# Patient Record
Sex: Male | Born: 1980 | Race: White | Hispanic: Yes | Marital: Married | Smoking: Current every day smoker
Health system: Southern US, Community
[De-identification: ages and names within clinical notes are randomized; demographics above are authoritative.]

## PROBLEM LIST (undated history)

## (undated) ENCOUNTER — Emergency Department: Payer: Self-pay | Source: Home / Self Care

## (undated) ENCOUNTER — Emergency Department (HOSPITAL_COMMUNITY): Disposition: A | Discharge status: Home / Self Care | Payer: Self-pay

## (undated) HISTORY — PX: OTHER SURGICAL HISTORY: SHX169

## (undated) HISTORY — PX: APPENDECTOMY: SHX54

---

## 2014-08-27 ENCOUNTER — Emergency Department (HOSPITAL_COMMUNITY)
Admission: EM | Admit: 2014-08-27 | Discharge: 2014-08-28 | Disposition: A | Discharge status: Home / Self Care | Payer: Self-pay | Attending: Emergency Medicine | Admitting: Emergency Medicine

## 2014-08-27 ENCOUNTER — Encounter (HOSPITAL_COMMUNITY): Payer: Self-pay | Admitting: *Deleted

## 2014-08-27 ENCOUNTER — Emergency Department (HOSPITAL_COMMUNITY): Payer: Self-pay

## 2014-08-27 DIAGNOSIS — Y998 Other external cause status: Secondary | ICD-10-CM | POA: Insufficient documentation

## 2014-08-27 DIAGNOSIS — S161XXA Strain of muscle, fascia and tendon at neck level, initial encounter: Secondary | ICD-10-CM | POA: Insufficient documentation

## 2014-08-27 DIAGNOSIS — Z720 Tobacco use: Secondary | ICD-10-CM | POA: Insufficient documentation

## 2014-08-27 DIAGNOSIS — Y9389 Activity, other specified: Secondary | ICD-10-CM | POA: Insufficient documentation

## 2014-08-27 DIAGNOSIS — Y9289 Other specified places as the place of occurrence of the external cause: Secondary | ICD-10-CM | POA: Insufficient documentation

## 2014-08-27 DIAGNOSIS — S298XXA Other specified injuries of thorax, initial encounter: Secondary | ICD-10-CM

## 2014-08-27 DIAGNOSIS — S29001A Unspecified injury of muscle and tendon of front wall of thorax, initial encounter: Secondary | ICD-10-CM | POA: Insufficient documentation

## 2014-08-27 DIAGNOSIS — S43401A Unspecified sprain of right shoulder joint, initial encounter: Secondary | ICD-10-CM | POA: Insufficient documentation

## 2014-08-27 DIAGNOSIS — W11XXXA Fall on and from ladder, initial encounter: Secondary | ICD-10-CM | POA: Insufficient documentation

## 2014-08-27 MED ORDER — OXYCODONE-ACETAMINOPHEN 5-325 MG PO TABS
2.0000 | ORAL_TABLET | Freq: Once | ORAL | Status: AC
  Administered 2014-08-27: 2 via ORAL
  Filled 2014-08-27: qty 2

## 2014-08-27 NOTE — ED Notes (Signed)
Pt fell 10 feet from ladder today around 1630 and landed on right side, c/o right elbow, right shoulder and right elbow pain, stiff in right neck and shoulder area

## 2014-08-27 NOTE — ED Provider Notes (Signed)
CSN: [id number]     Arrival date & time 08/27/14  2240 History   This chart was scribed for Joya Gaskinsonald W Hiilei Gerst, MD by Evon Slackerrance Branch, ED Scribe. This patient was seen in room APA06/[facility]06 and the patient's care was started at 11:00 PM.     Chief Complaint  Patient presents with  . Fall   Patient is a 33 y.o. male presenting with fall. The history is provided by the patient. No language interpreter was used.  Fall This is a new problem. The current episode started 6 to 12 hours ago. The problem occurs rarely. The problem has not changed since onset.Pertinent negatives include no chest pain, no abdominal pain and no headaches. Nothing relieves the symptoms. He has tried nothing for the symptoms.   HPI Comments: Jerry Hogan is a 33 y.o. male who presents to the Emergency Department complaining of fall onset 4:30 PM today. Pt states that he feel from a 10 ft ladder and landed on his right side. He states he is having right shoulder pain and neck pain. He states that he is unsure if he hit his head and denies LOC. Denies headache, chest pain, abdominal pain or back pain.   PMH - none  Past Surgical History  Procedure Laterality Date  . Appendectomy    . Ptsd     History reviewed. No pertinent family history. History  Substance Use Topics  . Smoking status: Current Every Day Smoker    Types: Cigarettes  . Smokeless tobacco: Not on file  . Alcohol Use: No    Review of Systems  Cardiovascular: Negative for chest pain.  Gastrointestinal: Negative for abdominal pain.  Musculoskeletal: Positive for arthralgias and neck pain. Negative for back pain.  Neurological: Negative for syncope and headaches.  All other systems reviewed and are negative.   Allergies  Review of patient's allergies indicates no known allergies.  Home Medications   Prior to Admission medications   Not on File   Triage Vitals: BP 155/105 mmHg  Pulse 69  Temp(Src) 98.1 F (36.7 C) (Oral)  Resp 18  Ht 5\' 7"   (1.702 m)  Wt 180 lb (81.647 kg)  BMI 28.19 kg/m2  SpO2 100%  Physical Exam  Nursing note and vitals reviewed.  CONSTITUTIONAL: Well developed/well nourished HEAD: Normocephalic/atraumatic EYES: EOMI/PERRL ENMT: Mucous membranes moist, No evidence of facial/nasal trauma NECK: no bruising to anterior neck SPINE:cervical spine tenderness, no thoracic or lumbar tenderness, No bruising/crepitance/stepoffs noted to spine CV: S1/S2 noted, no murmurs/rubs/gallops noted LUNGS: Lungs are clear to auscultation bilaterally, no apparent distress CHEST: right sided chest wall tenderness ABDOMEN: soft, nontender, no rebound or guarding, no bruising is noted GU:no cva tenderness NEURO: Pt is awake/alert, moves all extremitiesx4 EXTREMITIES: pulses normal, full ROM, Tenderness to palpation of right shoulder, All other extremities/joints palpated/ranged and nontender SKIN: warm, color normal PSYCH: no abnormalities of mood noted  ED Course  Procedures DIAGNOSTIC STUDIES: Oxygen Saturation is 100% on RA, normal by my interpretation.    COORDINATION OF CARE: 11:18 PM-Discussed treatment plan which includes X-ray of neck, right shoulder and chest and pain medication  with pt at bedside and pt agreed to plan.    Pt ambulatory He can range right shoulder He has no other new complaints He denies LOC - CT head deferred Arm sling given and referred to ortho   Imaging Review Dg Ribs Unilateral W/chest Right  08/27/2014   CLINICAL DATA:  Status post 10 foot fall from roof tonight. Right rib pain. Initial  encounter.  EXAM: RIGHT RIBS AND CHEST - 3+ VIEW  COMPARISON:  None.  FINDINGS: The lungs are clear. There is no pneumothorax or pleural effusion. Heart size is normal. No rib fracture is identified.  IMPRESSION: Negative examination.   Electronically Signed   By: Drusilla Kannerhomas  Dalessio M.D.   On: 08/27/2014 23:50   Dg Cervical Spine Complete  08/27/2014   CLINICAL DATA:  Status post 10 foot fall from a  roof tonight. Neck pain. Initial encounter.  EXAM: CERVICAL SPINE  4+ VIEWS  COMPARISON:  None.  FINDINGS: Vertebral body height and alignment are normal. Intervertebral disc space height is maintained. Neural foramina appear widely patent. Facet joints are unremarkable. Prevertebral soft tissues appear normal. The lung apices are clear.  IMPRESSION: Negative examination.   Electronically Signed   By: Drusilla Kannerhomas  Dalessio M.D.   On: 08/27/2014 23:51   Dg Shoulder Right  08/27/2014   CLINICAL DATA:  Pt c/o upper anterior shoulder, neck and rib pain s/p fall ten feet from standing on his roof tonight  EXAM: RIGHT SHOULDER - 2+ VIEW  COMPARISON:  None.  FINDINGS: There is no evidence of fracture or dislocation. There is no evidence of arthropathy or other focal bone abnormality. Soft tissues are unremarkable.  IMPRESSION: Negative.   Electronically Signed   By: Elberta Fortisaniel  Boyle M.D.   On: 08/27/2014 23:52      MDM   Final diagnoses:  Shoulder sprain, right, initial encounter  Blunt chest trauma, initial encounter  Cervical strain, acute, initial encounter        Nursing notes including past medical history and social history reviewed and considered in documentation xrays reviewed and considered   I personally performed the services described in this documentation, which was scribed in my presence. The recorded information has been reviewed and is accurate.       Joya Gaskinsonald W Maedell Hedger, MD 08/28/14 [phone]
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
## 2014-08-29 ENCOUNTER — Encounter (HOSPITAL_COMMUNITY): Payer: Self-pay | Admitting: *Deleted

## 2014-08-29 ENCOUNTER — Emergency Department (HOSPITAL_COMMUNITY)
Admission: EM | Admit: 2014-08-29 | Discharge: 2014-08-29 | Disposition: A | Discharge status: Home / Self Care | Payer: Self-pay | Attending: Emergency Medicine | Admitting: Emergency Medicine

## 2014-08-29 ENCOUNTER — Telehealth: Payer: Self-pay | Admitting: Orthopedic Surgery

## 2014-08-29 DIAGNOSIS — Y9389 Activity, other specified: Secondary | ICD-10-CM | POA: Insufficient documentation

## 2014-08-29 DIAGNOSIS — Y929 Unspecified place or not applicable: Secondary | ICD-10-CM | POA: Insufficient documentation

## 2014-08-29 DIAGNOSIS — M542 Cervicalgia: Secondary | ICD-10-CM | POA: Insufficient documentation

## 2014-08-29 DIAGNOSIS — S46911D Strain of unspecified muscle, fascia and tendon at shoulder and upper arm level, right arm, subsequent encounter: Secondary | ICD-10-CM | POA: Insufficient documentation

## 2014-08-29 DIAGNOSIS — W132XXD Fall from, out of or through roof, subsequent encounter: Secondary | ICD-10-CM | POA: Insufficient documentation

## 2014-08-29 DIAGNOSIS — Z720 Tobacco use: Secondary | ICD-10-CM | POA: Insufficient documentation

## 2014-08-29 MED ORDER — CYCLOBENZAPRINE HCL 10 MG PO TABS: 10.0000 mg | ORAL_TABLET | Freq: Two times a day (BID) | ORAL | Status: AC | PRN

## 2014-08-29 MED ORDER — HYDROCODONE-ACETAMINOPHEN 5-325 MG PO TABS: 1.0000 | ORAL_TABLET | ORAL | Status: AC | PRN

## 2014-08-29 NOTE — Discharge Instructions (Signed)
Cervical Sprain °A cervical sprain is an injury in the neck in which the strong, fibrous tissues (ligaments) that connect your neck bones stretch or tear. Cervical sprains can range from mild to severe. Severe cervical sprains can cause the neck vertebrae to be unstable. This can lead to damage of the spinal cord and can result in serious nervous system problems. The amount of time it takes for a cervical sprain to get better depends on the cause and extent of the injury. Most cervical sprains heal in 1 to 3 weeks. °CAUSES  °Severe cervical sprains may be caused by:  °· Contact sport injuries (such as from football, rugby, wrestling, hockey, auto racing, gymnastics, diving, martial arts, or boxing).   °· Motor vehicle collisions.   °· Whiplash injuries. This is an injury from a sudden forward and backward whipping movement of the head and neck.  °· Falls.   °Mild cervical sprains may be caused by:  °· Being in an awkward position, such as while cradling a telephone between your ear and shoulder.   °· Sitting in a chair that does not offer proper support.   °· Working at a poorly designed computer station.   °· Looking up or down for long periods of time.   °SYMPTOMS  °· Pain, soreness, stiffness, or a burning sensation in the front, back, or sides of the neck. This discomfort may develop immediately after the injury or slowly, 24 hours or more after the injury.   °· Pain or tenderness directly in the middle of the back of the neck.   °· Shoulder or upper back pain.   °· Limited ability to move the neck.   °· Headache.   °· Dizziness.   °· Weakness, numbness, or tingling in the hands or arms.   °· Muscle spasms.   °· Difficulty swallowing or chewing.   °· Tenderness and swelling of the neck.   °DIAGNOSIS  °Most of the time your health care provider can diagnose a cervical sprain by taking your history and doing a physical exam. Your health care provider will ask about previous neck injuries and any known neck  problems, such as arthritis in the neck. X-rays may be taken to find out if there are any other problems, such as with the bones of the neck. Other tests, such as a CT scan or MRI, may also be needed.  °TREATMENT  °Treatment depends on the severity of the cervical sprain. Mild sprains can be treated with rest, keeping the neck in place (immobilization), and pain medicines. Severe cervical sprains are immediately immobilized. Further treatment is done to help with pain, muscle spasms, and other symptoms and may include: °· Medicines, such as pain relievers, numbing medicines, or muscle relaxants.   °· Physical therapy. This may involve stretching exercises, strengthening exercises, and posture training. Exercises and improved posture can help stabilize the neck, strengthen muscles, and help stop symptoms from returning.   °HOME CARE INSTRUCTIONS  °· Put ice on the injured area.   °¨ Put ice in a plastic bag.   °¨ Place a towel between your skin and the bag.   °¨ Leave the ice on for 15-20 minutes, 3-4 times a day.   °· If your injury was severe, you may have been given a cervical collar to wear. A cervical collar is a two-piece collar designed to keep your neck from moving while it heals. °¨ Do not remove the collar unless instructed by your health care provider. °¨ If you have long hair, keep it outside of the collar. °¨ Ask your health care provider before making any adjustments to your collar. Minor   adjustments may be required over time to improve comfort and reduce pressure on your chin or on the back of your head. °¨ If you are allowed to remove the collar for cleaning or bathing, follow your health care provider's instructions on how to do so safely. °¨ Keep your collar clean by wiping it with mild soap and water and drying it completely. If the collar you have been given includes removable pads, remove them every 1-2 days and hand wash them with soap and water. Allow them to air dry. They should be completely  dry before you wear them in the collar. °¨ If you are allowed to remove the collar for cleaning and bathing, wash and dry the skin of your neck. Check your skin for irritation or sores. If you see any, tell your health care provider. °¨ Do not drive while wearing the collar.   °· Only take over-the-counter or prescription medicines for pain, discomfort, or fever as directed by your health care provider.   °· Keep all follow-up appointments as directed by your health care provider.   °· Keep all physical therapy appointments as directed by your health care provider.   °· Make any needed adjustments to your workstation to promote good posture.   °· Avoid positions and activities that make your symptoms worse.   °· Warm up and stretch before being active to help prevent problems.   °SEEK MEDICAL CARE IF:  °· Your pain is not controlled with medicine.   °· You are unable to decrease your pain medicine over time as planned.   °· Your activity level is not improving as expected.   °SEEK IMMEDIATE MEDICAL CARE IF:  °· You develop any bleeding. °· You develop stomach upset. °· You have signs of an allergic reaction to your medicine.   °· Your symptoms get worse.   °· You develop new, unexplained symptoms.   °· You have numbness, tingling, weakness, or paralysis in any part of your body.   °MAKE SURE YOU:  °· Understand these instructions. °· Will watch your condition. °· Will get help right away if you are not doing well or get worse. °Document Released: 08/03/2007 Document Revised: 10/11/2013 Document Reviewed: 04/13/2013 °ExitCare® Patient Information ©2015 [employer]. This information is not intended to replace advice given to you by your health care provider. Make sure you discuss any questions you have with your health care provider. ° ° °Emergency Department Resource Guide °1) Find a Doctor and Pay Out of Pocket °Although you won't have to find out who is covered by your insurance plan, it is a good idea to ask  around and get recommendations. You will then need to call the office and see if the doctor you have chosen will accept you as a new patient and what types of options they offer for patients who are self-pay. Some doctors offer discounts or will set up payment plans for their patients who do not have insurance, but you will need to ask so you aren't surprised when you get to your appointment. ° °2) Contact Your Local Health Department °Not all health departments have doctors that can see patients for sick visits, but many do, so it is worth a call to see if yours does. If you don't know where your local health department is, you can check in your phone book. The CDC also has a tool to help you locate your state's health department, and many state websites also have listings of all of their local health departments. ° °3) Find a Walk-in Clinic °If your illness is not   likely to be very severe or complicated, you may want to try a walk in clinic. These are popping up all over the country in pharmacies, drugstores, and shopping centers. They're usually staffed by nurse practitioners or physician assistants that have been trained to treat common illnesses and complaints. They're usually fairly quick and inexpensive. However, if you have serious medical issues or chronic medical problems, these are probably not your best option. ° °No Primary Care Doctor: °- Call [facility] at  832-8000 - they can help you locate a primary care doctor that  accepts your insurance, provides certain services, etc. °- Physician Referral Service- 1-800-533-3463 ° °Chronic Pain Problems: °Organization         Address  Phone   Notes  °[facility]  (336) 297-2271 Patients need to be referred by their primary care doctor.  ° °Medication Assistance: °Organization         Address  Phone   Notes  °[place] [facility] [address] °[place], [place] [address] (336) 641-8030 --Must be a  resident of [place] °-- Must have NO insurance coverage whatsoever (no Medicaid/ Medicare, etc.) °-- The pt. MUST have a primary care doctor that directs their care regularly and follows them in the community °  °[facility]  (866) 331-1348   °[employer]  (888) 892-1162   ° °Agencies that provide inexpensive medical care: °Organization         Address  Phone   Notes  °[facility]  (336) 832-8035   °[facility]    (336) 832-7272   °[facility] [address] °[place], [place] [address] (336) 832-4777   °[facility] [address], °[place] (336) 271-4999   °[facility]    (336) 373-0678   °[facility]    (336) 272-1050   °[facility] ° [address], [place] Phone:  (336) 832-4444, Fax:  (336) 832-4440 Hours of Operation:  9 am - 6 pm, M-F.  Also accepts Medicaid/Medicare and self-pay.  °[facility] ° [address], [place] Phone: (336) 832-3150, Fax: (336) 832-3151. Hours of Operation:  8:30 am - 5:30 pm, M-F.  Also accepts Medicaid and self-pay.  °[facility] [place] [address], [place] Phone: (336) 878-6027   °[facility] [address], [place], [place] (336)723-1848, Ext. 123 Mondays & Thursdays: 7-9 AM.  First 15 patients are seen on a first come, first serve basis. °  ° °Medicaid-accepting [place] Providers: ° °Organization         Address  Phone   Notes  °[facility] [address], [place] (336) 641-2100 Also accepts self-pay patients.  °[facility] [address], [place] ° (336) 856-9996   °[facility] [address], [place] (336) 288-8857   °[facility] [address] [place] [address], [place] (336) 299-7000   °Veita Bland [address], [place]  ° (336) 373-1557 Only accepts  [place] Access Medicaid patients after they have their name applied to their card.  ° °Self-Pay (no insurance) in [place]: ° °Organization         Address  Phone   Notes  °Sickle Cell Patients, [facility] [address], [place] (336) 832-1970   °   [facility] [address], [place] (336) 832-4400   °[facility] [place] ° [address], [place] (336) 992-4800   °[facility]/Dr. Osei-Bonsu ° [address] [place] [address], [place] or [address], [place] (336) 841-8500 Phone number for both [place] and [place] locations is the same.  °[facility] [address], [place] (336) 299-0000   °[facility] [address] [place] [address], [place] or [address] (336) 852-7530 °(336) 878-2260   °[facility] [address], [place] (336) 350-1642, phone; (336) 294-5005, fax Sees patients 1st and 3rd Saturday of every month.  Must not qualify for public or private insurance (i.e. Medicaid, Medicare, [place] Health Choice, Veterans' Benefits) • Household income should be no more than 200% of the poverty level •The clinic cannot treat you if you are pregnant or think you are pregnant • Sexually transmitted diseases are not treated at the clinic.  ° ° °Dental Care: °Organization         Address  Phone  Notes  °[facility] [address], [place] (336) 641-6152 Accepts children up to age 21 who are enrolled in Medicaid or [place] Health Choice; pregnant women with a Medicaid card; and children who have applied for Medicaid or [place] Health Choice, but were declined, whose parents can pay a reduced fee at time of service.  °[facility] [place]  [address], [place] (336) 641-7733 Accepts children up to age 21 who are enrolled in Medicaid or [place] Health Choice; pregnant women  with a Medicaid card; and children who have applied for Medicaid or [place] Health Choice, but were declined, whose parents can pay a reduced fee at time of service.  °[facility] ° [address], [place] (336) 641-4533 Patients are seen by appointment only. Walk-ins are not accepted. [facility] will see patients 18 years of age and older. °Monday - Tuesday (8am-5pm) °Most Wednesdays (8:30-5pm) °$30 per visit, cash only  °[facility] ° [address], [place] (336) 641-4533 Patients are seen by appointment only. Walk-ins are not accepted. [facility] will see patients 18 years of age and older. °One Wednesday Evening (Monthly: Volunteer Based).  $30 per visit, cash only  °[facility]  (919) 537-3737 for adults; Children under age 4, call Graduate Pediatric Dentistry at (919) [phone]. Children aged 4-14, please call (919) [phone] to request a pediatric application. ° Dental services are provided in all areas of dental care including fillings, crowns and bridges, complete and partial dentures, implants, gum treatment, root canals, and extractions. Preventive care is also provided. Treatment is provided to both adults and children. °Patients are selected via a lottery and there is often a waiting list. °  °[facility] [address], °[place] ° (336) 763-8833 [email or web address] °  °[facility] [address], [place], [place] (336)723-1848, Ext. 123 Second and Fourth Thursday of each month, opens at 6:30 AM; Clinic ends at 9 AM.  Patients are seen on a first-come first-served basis, and a limited number are seen during each clinic.  ° °[facility] ° [address], [place], [place] (336) 723-7904   Eligibility Requirements °You must have lived in [place], [place], or [place] counties for at least the last three months. °    You cannot be eligible for state or federal sponsored healthcare  insurance, including Veterans Administration, Medicaid, or Medicare. °  You generally cannot be eligible for healthcare insurance through your employer.  °  How to apply: °Eligibility screenings are held every Tuesday and Wednesday afternoon from 1:00 pm until 4:00 pm. You do not need an appointment for the interview!  °[facility] [address], [place], [place] 336-631-2330   °[facility]  336-342-8273   °[facility]  336-703-3100   °[facility]  336-570-6415   ° °Behavioral Health Resources in the Community: °Intensive Outpatient Programs °Organization         Address  Phone  Notes  °[facility] [address], [place], [place] 336-878-6098   °[facility] [address], [place], [place] 336-832-9800   °[facility] [address], [place], [place] ° 336-882-2125   °[facility] [address],  °[place], [place] 1-800-853-5163 or 336-641-4981   °Substance Abuse Resources °Organization         Address  Phone  Notes  °[facility]  336-882-2125   °[facility]  336-784-9470   °[facility]  336-285-9073   °[facility]  336-845-3988   °Residential & Outpatient Substance Abuse Program  1-800-659-3381   °Psychological Services °Organization         Address  Phone  Notes  °[facility]  336- 832-9600   °[facility]  336- 378-7881   °[facility] [address], [place] 1-800-853-5163 or 336-641-4981   ° °Mobile Crisis Teams °Organization         Address  Phone  Notes  °[facility], Mobile Crisis Care Unit  1-877-626-1772   °Assertive °[facility] ° [address] [place], [place] 336-834-9664   °Sharon DeEsch [address] °[place] 336-554-5454   ° °Self-Help/Support Groups °Organization         Address  Phone             Notes  °[facility] - variety of support groups  336- 373-1402 Call for more information  °Narcotics Anonymous (NA), [facility] [address], °[place]  2 meetings at this location  ° °Residential Treatment Programs °Organization         Address  Phone  Notes  °[facility] [address],    °[place]  1-866-801-8205   °[facility] ° [address], [place], [place] 704-293-8524   °[facility] [address], [place] 336-845-3988 Admissions: 8am-3pm M-F  °[facility] [address]    °[place], [place] 336-841-1104   °[facility] [address], [place], [place] 336-379-7146   °[facility] [address]  °[place], [place] 336-285-9073   °[facility] Intensive Outpatient [address], [place], [place] 336-852-3033   °[facility] [address]  °[place], [place] 1-877-615-2722 or 336-784-9470   °[facility] [address] [place], [place] 336-227-7417 Accepts Medicaid  °[facility] [address]  °[place] 1-800-659-3381 Substance Abuse/Addiction Treatment  ° °[place] Behavioral Health Resources °Organization         Address  Phone  Notes  °[facility]  (888) 581-9988   °Julie Brannon, PhD [address] [place], [place]   (336) 349-5553 or (336) 951-0000   °[facility]   [address] °[place], [place] (336) 349-4454   °[facility] [address], [place], [place] (336) 342-8316 Insurance/Medicaid/sponsorship through [facility]  °[facility] [address]                                    [place], [place] (336) 342-8316 Therapy/tele-psych/case  °[facility] [address]  ° [place], [place] (336) 349-2233    °Dr. Arfeen  (336) 349-4544   °[facility] [place]  [employer] [place] [facility] 1) [address],  [place] °2) [address], [place] °3)  [address], [place] (336) 349-3220 °(336) 342-7768 ° °(336) 342-8140   °[place] [facility] (336) 342-1394 or (336) 342-3537 (After Hours)    ° ° ° °

## 2014-08-29 NOTE — ED Notes (Signed)
Fell from ladder on Sunday,  Here for recheck.  Cont to have pain in rt shoulder, neck and headache.  Could not afford to be seen by ortho.  Rt arm in sling

## 2014-08-29 NOTE — Telephone Encounter (Signed)
Patient called today, 08/29/14, following [facility], for problem of a fall, in which he hurt his right shoulder and back (no fractures per chart notes).  I offered appointment, however, patient states he does not have insurance (was unable to keep the market place "Obama care" insurance due to cost; states not eligible for Medicaid.  I reviewed the self-pay minimum amount per policy, which he states he would be unable to do, but states he can pay $50.00.  He is looking into options.  His phone # is [phone].

## 2014-08-29 NOTE — ED Provider Notes (Signed)
CSN: [id number]     Arrival date & time 08/29/14  1210 History   First MD Initiated Contact with Patient 08/29/14 1229     Chief Complaint  Patient presents with  . Shoulder Pain     (Consider location/radiation/quality/duration/timing/severity/associated sxs/prior Treatment) HPI Comments: Pt comes in today with continued complaints of pain in his right shoulder and right neck. Pt states that he fell off a 10 foot ladder 2 days ago and seen and was x-rayed at that time in the er. Nothing was broken. She states that he wasn't given anything for pain and ibuprofen is not working. Denies numbness or weakness. States that he has been using the sling as directed. He states that he called Dr. Romeo AppleHarrison as told but he can't pay the 400 dollars to be seen.  The history is provided by the patient. No language interpreter was used.    History reviewed. No pertinent past medical history. Past Surgical History  Procedure Laterality Date  . Appendectomy    . Ptsd     History reviewed. No pertinent family history. History  Substance Use Topics  . Smoking status: Current Every Day Smoker    Types: Cigarettes  . Smokeless tobacco: Not on file  . Alcohol Use: No    Review of Systems  All other systems reviewed and are negative.     Allergies  Review of patient's allergies indicates no known allergies.  Home Medications   Prior to Admission medications   Medication Sig Start Date End Date Taking? Authorizing Provider  cyclobenzaprine (FLEXERIL) 10 MG tablet Take 1 tablet (10 mg total) by mouth 2 (two) times daily as needed for muscle spasms. 08/29/14   Teressa LowerVrinda Licet Dunphy, NP  HYDROcodone-acetaminophen (NORCO/VICODIN) 5-325 MG per tablet Take 1-2 tablets by mouth every 4 (four) hours as needed. 08/29/14   Teressa LowerVrinda Diontae Route, NP   BP 141/88 mmHg  Pulse 82  Temp(Src) 98.2 F (36.8 C) (Oral)  Resp 18  Ht 5\' 7"  (1.702 m)  Wt 180 lb (81.647 kg)  BMI 28.19 kg/m2  SpO2 98% Physical Exam   Constitutional: He is oriented to person, place, and time. He appears well-developed and well-nourished.  HENT:  Head: Normocephalic and atraumatic.  Eyes: Conjunctivae and EOM are normal. Pupils are equal, round, and reactive to light.  Cardiovascular: Normal rate and regular rhythm.   Pulmonary/Chest: Effort normal and breath sounds normal.  Musculoskeletal:  Right paraspinal tenderness. Equal grip strength. Right shoulder non tender to palpation. Movement grossly intact  Neurological: He is alert and oriented to person, place, and time.  Skin: Skin is warm.  Psychiatric: He has a normal mood and affect.  Nursing note and vitals reviewed.   ED Course  Procedures (including critical care time) Labs Review Labs Reviewed - No data to display  Imaging Review Dg Ribs Unilateral W/chest Right  08/27/2014   CLINICAL DATA:  Status post 10 foot fall from roof tonight. Right rib pain. Initial encounter.  EXAM: RIGHT RIBS AND CHEST - 3+ VIEW  COMPARISON:  None.  FINDINGS: The lungs are clear. There is no pneumothorax or pleural effusion. Heart size is normal. No rib fracture is identified.  IMPRESSION: Negative examination.   Electronically Signed   By: Drusilla Kannerhomas  Dalessio M.D.   On: 08/27/2014 23:50   Dg Cervical Spine Complete  08/27/2014   CLINICAL DATA:  Status post 10 foot fall from a roof tonight. Neck pain. Initial encounter.  EXAM: CERVICAL SPINE  4+ VIEWS  COMPARISON:  None.  FINDINGS: Vertebral body height and alignment are normal. Intervertebral disc space height is maintained. Neural foramina appear widely patent. Facet joints are unremarkable. Prevertebral soft tissues appear normal. The lung apices are clear.  IMPRESSION: Negative examination.   Electronically Signed   By: Drusilla Kannerhomas  Dalessio M.D.   On: 08/27/2014 23:51   Dg Shoulder Right  08/27/2014   CLINICAL DATA:  Pt c/o upper anterior shoulder, neck and rib pain s/p fall ten feet from standing on his roof tonight  EXAM: RIGHT  SHOULDER - 2+ VIEW  COMPARISON:  None.  FINDINGS: There is no evidence of fracture or dislocation. There is no evidence of arthropathy or other focal bone abnormality. Soft tissues are unremarkable.  IMPRESSION: Negative.   Electronically Signed   By: Elberta Fortisaniel  Boyle M.D.   On: 08/27/2014 23:52     EKG Interpretation None      MDM   Final diagnoses:  Shoulder strain, right, subsequent encounter  Neck pain    Pt given hydrocodone and flexeril and given community resources. Don't think further imaging is needed.    Teressa LowerVrinda Etty Isaac, NP 08/29/14 1255  Lyanne CoKevin M Campos, MD 08/29/14 [phone]

## 2014-09-05 NOTE — Telephone Encounter (Signed)
I have called back to follow up with patient as no response received; reached voice mail - left a message to return call.

## 2016-11-05 IMAGING — CR DG RIBS W/ CHEST 3+V*R*
4 series · 4 of 4 positions shown · non-contrast
Comparison: None.

CLINICAL DATA: Status post 10 foot fall from roof tonight. Right
rib pain. Initial encounter.

EXAM:
RIGHT RIBS AND CHEST - 3+ VIEW

[view not recorded (1 of 4)]
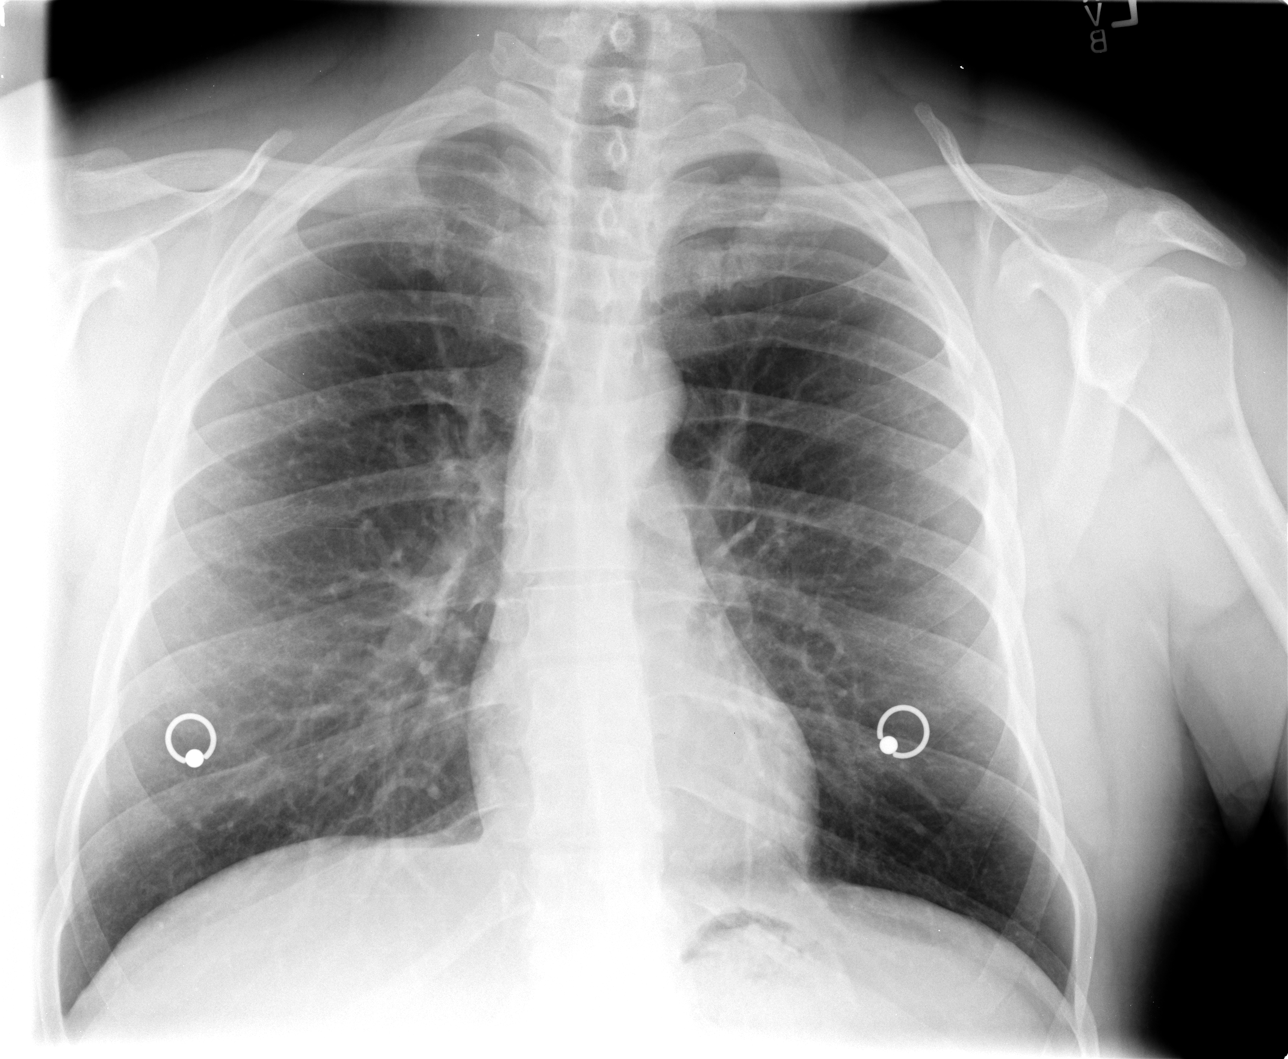

[view not recorded (2 of 4)]
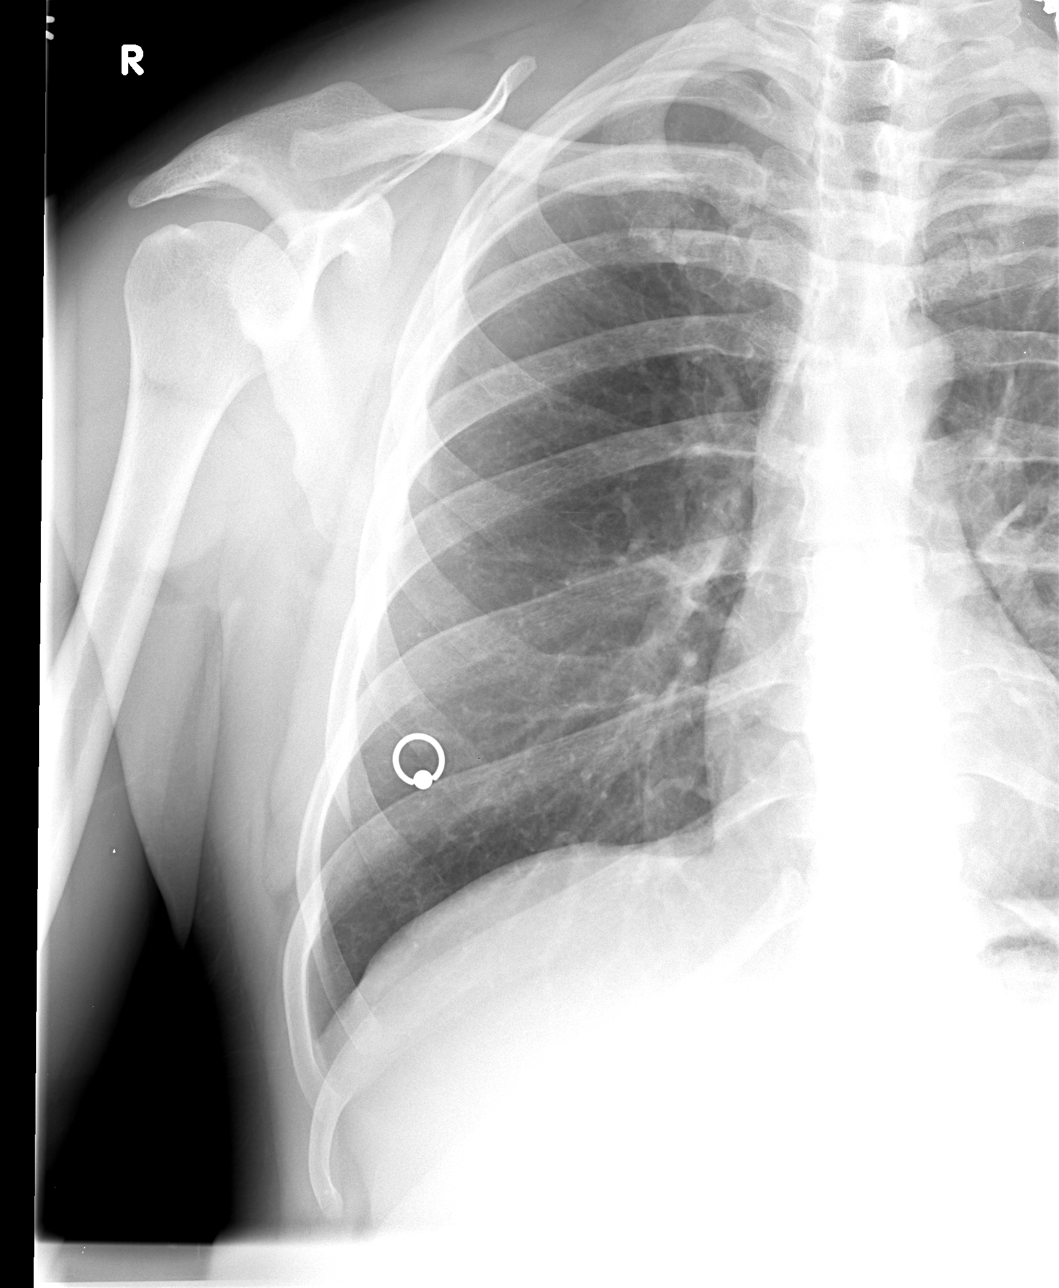

[view not recorded (3 of 4)]
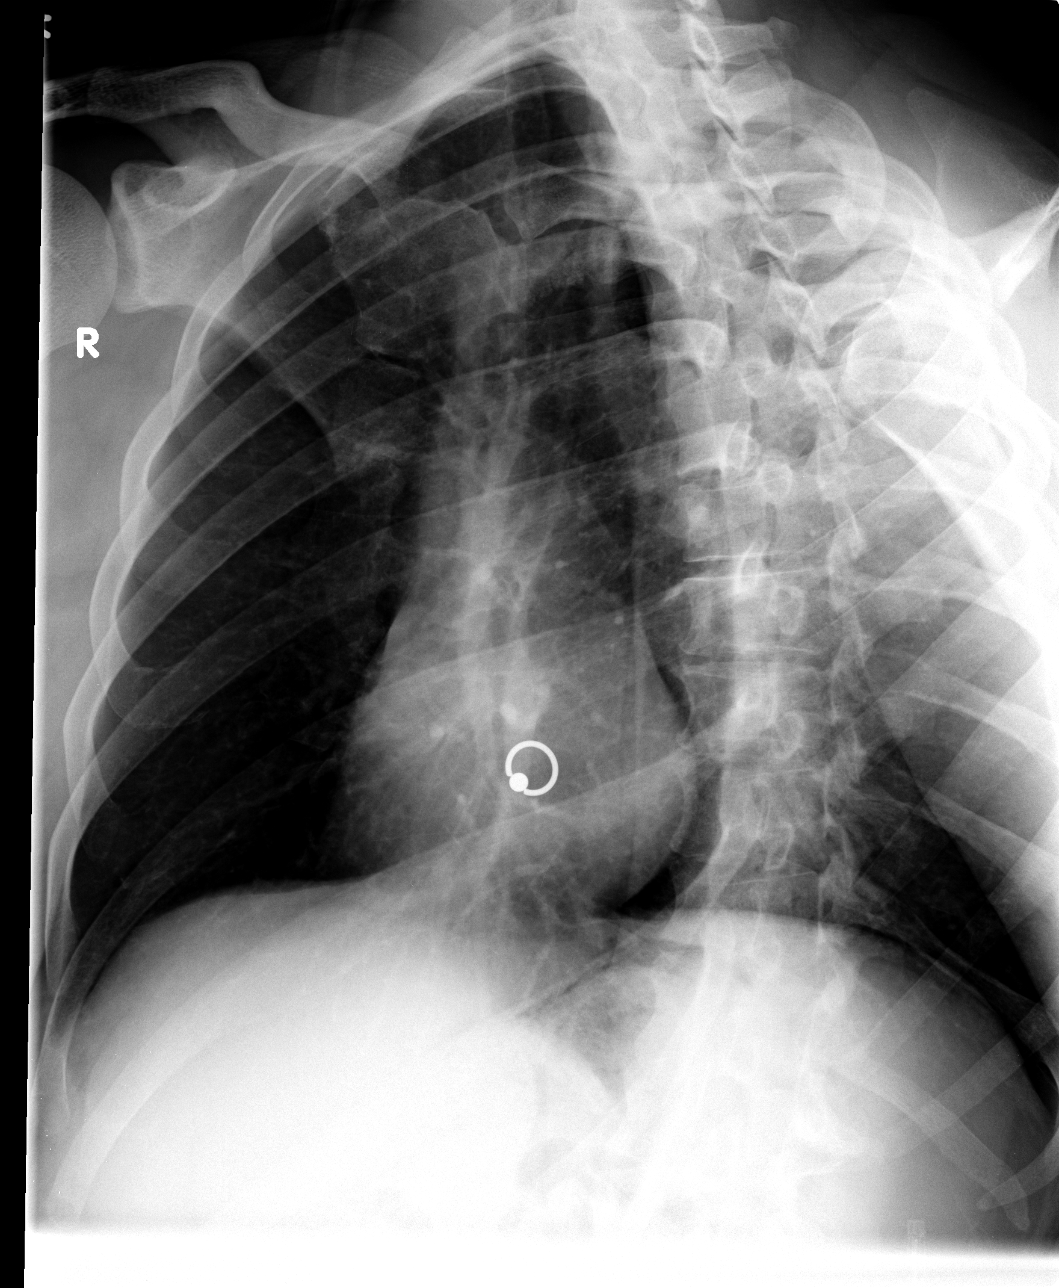

[view not recorded (4 of 4)]
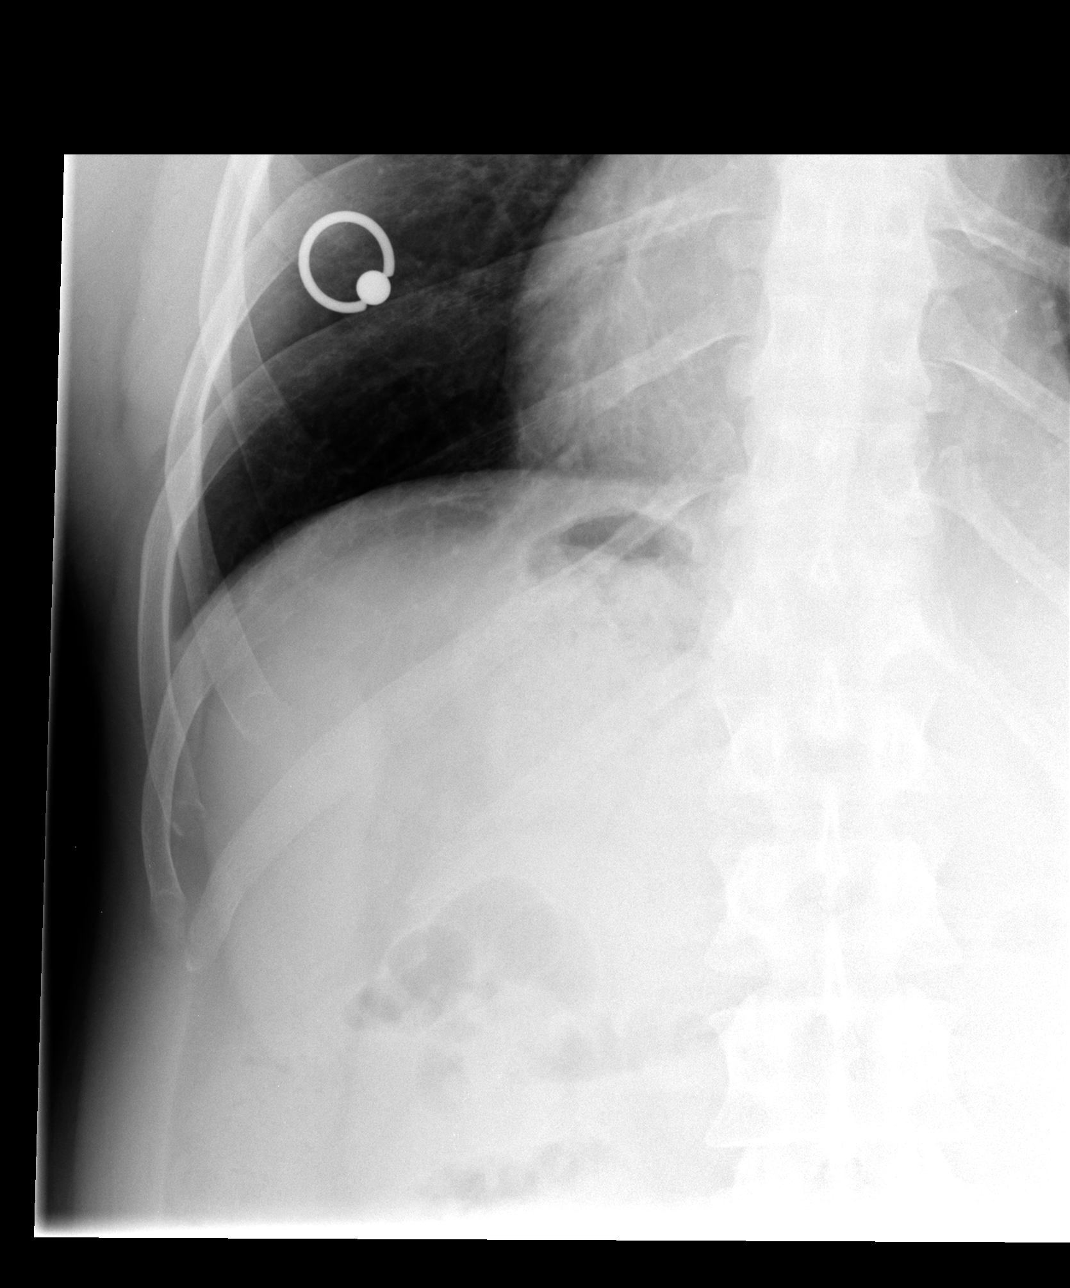

[4 of 4 positions shown; findings below may reference images not displayed]

FINDINGS: The lungs are clear. There is no pneumothorax or pleural effusion.
Heart size is normal. No rib fracture is identified.
IMPRESSION: Negative examination.
